# Patient Record
Sex: Female | Born: 1970 | Race: Black or African American | Hispanic: No | Marital: Single
Health system: Southern US, Community
[De-identification: ages and names within clinical notes are randomized; demographics above are authoritative.]

## PROBLEM LIST (undated history)

## (undated) DIAGNOSIS — F338 Other recurrent depressive disorders: Secondary | ICD-10-CM

## (undated) DIAGNOSIS — F419 Anxiety disorder, unspecified: Secondary | ICD-10-CM

## (undated) DIAGNOSIS — M25531 Pain in right wrist: Secondary | ICD-10-CM

## (undated) DIAGNOSIS — G4733 Obstructive sleep apnea (adult) (pediatric): Secondary | ICD-10-CM

## (undated) DIAGNOSIS — R1310 Dysphagia, unspecified: Secondary | ICD-10-CM

## (undated) DIAGNOSIS — Z90710 Acquired absence of both cervix and uterus: Secondary | ICD-10-CM

## (undated) DIAGNOSIS — M2140 Flat foot [pes planus] (acquired), unspecified foot: Secondary | ICD-10-CM

## (undated) DIAGNOSIS — M25512 Pain in left shoulder: Secondary | ICD-10-CM

## (undated) DIAGNOSIS — D509 Iron deficiency anemia, unspecified: Secondary | ICD-10-CM

## (undated) DIAGNOSIS — F339 Major depressive disorder, recurrent, unspecified: Secondary | ICD-10-CM

## (undated) DIAGNOSIS — F411 Generalized anxiety disorder: Secondary | ICD-10-CM

## (undated) DIAGNOSIS — R002 Palpitations: Secondary | ICD-10-CM

## (undated) DIAGNOSIS — M5137 Other intervertebral disc degeneration, lumbosacral region: Secondary | ICD-10-CM

## (undated) DIAGNOSIS — I10 Essential (primary) hypertension: Secondary | ICD-10-CM

## (undated) DIAGNOSIS — M51379 Other intervertebral disc degeneration, lumbosacral region without mention of lumbar back pain or lower extremity pain: Secondary | ICD-10-CM

## (undated) DIAGNOSIS — Z6835 Body mass index (BMI) 35.0-35.9, adult: Secondary | ICD-10-CM

## (undated) DIAGNOSIS — E119 Type 2 diabetes mellitus without complications: Secondary | ICD-10-CM

## (undated) DIAGNOSIS — T7523XA Vertigo from infrasound, initial encounter: Secondary | ICD-10-CM

## (undated) DIAGNOSIS — J45998 Other asthma: Secondary | ICD-10-CM

## (undated) DIAGNOSIS — J309 Allergic rhinitis, unspecified: Secondary | ICD-10-CM

## (undated) DIAGNOSIS — N83209 Unspecified ovarian cyst, unspecified side: Secondary | ICD-10-CM

## (undated) DIAGNOSIS — K219 Gastro-esophageal reflux disease without esophagitis: Secondary | ICD-10-CM

## (undated) HISTORY — DX: Flat foot (pes planus) (acquired), unspecified foot: M21.40

## (undated) HISTORY — DX: Pain in right wrist: M25.531

## (undated) HISTORY — DX: Generalized anxiety disorder: F41.1

## (undated) HISTORY — DX: Anxiety disorder, unspecified: F41.9

## (undated) HISTORY — DX: Iron deficiency anemia, unspecified: D50.9

## (undated) HISTORY — DX: Palpitations: R00.2

## (undated) HISTORY — DX: Dysphagia, unspecified: R13.10

## (undated) HISTORY — DX: Allergic rhinitis, unspecified: J30.9

## (undated) HISTORY — DX: Gastro-esophageal reflux disease without esophagitis: K21.9

## (undated) HISTORY — DX: Obstructive sleep apnea (adult) (pediatric): G47.33

## (undated) HISTORY — DX: Acquired absence of both cervix and uterus: Z90.710

## (undated) HISTORY — DX: Unspecified ovarian cyst, unspecified side: N83.209

## (undated) HISTORY — DX: Pain in left shoulder: M25.512

## (undated) HISTORY — DX: Other recurrent depressive disorders: F33.8

## (undated) HISTORY — DX: Major depressive disorder, recurrent, unspecified: F33.9

## (undated) HISTORY — DX: Type 2 diabetes mellitus without complications: E11.9

## (undated) HISTORY — DX: Essential (primary) hypertension: I10

## (undated) HISTORY — DX: Vertigo from infrasound, initial encounter: T75.23XA

## (undated) HISTORY — DX: Other intervertebral disc degeneration, lumbosacral region: M51.37

## (undated) HISTORY — DX: Body mass index (BMI) 35.0-35.9, adult: Z68.35

## (undated) HISTORY — DX: Other intervertebral disc degeneration, lumbosacral region without mention of lumbar back pain or lower extremity pain: M51.379

## (undated) HISTORY — PX: ABDOMINAL HYSTERECTOMY: SHX81

## (undated) HISTORY — DX: Other asthma: J45.998

---

## 2018-09-07 ENCOUNTER — Telehealth: Payer: Self-pay | Admitting: *Deleted

## 2018-09-07 DIAGNOSIS — R079 Chest pain, unspecified: Secondary | ICD-10-CM

## 2018-09-07 NOTE — Telephone Encounter (Signed)
No need to hold modafinil

## 2018-09-07 NOTE — Telephone Encounter (Signed)
Patient has been made aware of all instructions and has been instructed to call back with any questions.

## 2018-09-07 NOTE — Telephone Encounter (Signed)
Request from the [facility] for the patient to have a nuclear treadmill test. Order has been placed and instructions given. Message will be sent to scheduling.  The patient has been made aware of medications to hold but would like to know if she needs to hold modafinil the day of the test. She takes the Modafinil as a stimulant to help keep her awake throughout the day.  Medications to hold: All diabetic medications the morning of the test  1. Do not eat or drink after midnight 2. No caffeine for 24 hours prior to test 3. No smoking 24 hours prior to test. 4. Your medication may be taken with water.  If your doctor stopped a medication because of this test, do not take that medication. 5. Ladies, please do not wear dresses.  Skirts or pants are appropriate. Please wear a short sleeve shirt. 6. No perfume, cologne or lotion. 7. Wear comfortable walking shoes. No heels!

## 2018-09-13 ENCOUNTER — Ambulatory Visit: Payer: Self-pay | Admitting: Cardiovascular Disease

## 2018-09-15 ENCOUNTER — Other Ambulatory Visit: Payer: Self-pay

## 2018-09-15 ENCOUNTER — Ambulatory Visit
Admission: RE | Admit: 2018-09-15 | Discharge: 2018-09-15 | Disposition: A | Discharge status: Home / Self Care | Payer: No Typology Code available for payment source | Source: Ambulatory Visit | Attending: Cardiovascular Disease | Admitting: Cardiovascular Disease

## 2018-09-15 DIAGNOSIS — R079 Chest pain, unspecified: Secondary | ICD-10-CM | POA: Diagnosis not present

## 2018-09-15 LAB — NM MYOCAR MULTI W/SPECT W/WALL MOTION / EF
CSEPPHR: 171 {beats}/min
Estimated workload: 9 METS
Exercise duration (min): 7 min
Exercise duration (sec): 20 s
LV dias vol: 27 mL (ref 46–106)
LV sys vol: 9 mL
MPHR: 175 {beats}/min
Percent HR: 97 %
Rest HR: 84 {beats}/min
TID: 0.71

## 2018-09-15 MED ORDER — TECHNETIUM TC 99M TETROFOSMIN IV KIT
10.7200 | PACK | Freq: Once | INTRAVENOUS | Status: AC | PRN
  Administered 2018-09-15: 10.72 via INTRAVENOUS

## 2018-09-15 MED ORDER — TECHNETIUM TC 99M TETROFOSMIN IV KIT
30.0000 | PACK | Freq: Once | INTRAVENOUS | Status: AC | PRN
  Administered 2018-09-15: 30.41 via INTRAVENOUS

## 2019-04-27 ENCOUNTER — Other Ambulatory Visit: Payer: Self-pay

## 2019-04-27 DIAGNOSIS — M25552 Pain in left hip: Secondary | ICD-10-CM

## 2019-05-10 ENCOUNTER — Ambulatory Visit
Admission: RE | Admit: 2019-05-10 | Discharge: 2019-05-10 | Disposition: A | Discharge status: Home / Self Care | Payer: No Typology Code available for payment source | Source: Ambulatory Visit | Attending: Diagnostic Radiology | Admitting: Diagnostic Radiology

## 2019-05-10 ENCOUNTER — Other Ambulatory Visit: Payer: Self-pay

## 2019-05-10 DIAGNOSIS — M1611 Unilateral primary osteoarthritis, right hip: Secondary | ICD-10-CM | POA: Diagnosis not present

## 2019-05-10 DIAGNOSIS — M25552 Pain in left hip: Secondary | ICD-10-CM | POA: Diagnosis present

## 2021-07-19 ENCOUNTER — Encounter: Payer: Self-pay | Admitting: Emergency Medicine

## 2021-07-19 ENCOUNTER — Other Ambulatory Visit: Payer: Self-pay

## 2021-07-19 ENCOUNTER — Emergency Department: Payer: No Typology Code available for payment source

## 2021-07-19 ENCOUNTER — Emergency Department
Admission: EM | Admit: 2021-07-19 | Discharge: 2021-07-19 | Disposition: A | Discharge status: Home / Self Care | Payer: No Typology Code available for payment source | Attending: Emergency Medicine | Admitting: Emergency Medicine

## 2021-07-19 DIAGNOSIS — E876 Hypokalemia: Secondary | ICD-10-CM | POA: Diagnosis not present

## 2021-07-19 DIAGNOSIS — R072 Precordial pain: Secondary | ICD-10-CM | POA: Diagnosis present

## 2021-07-19 DIAGNOSIS — I10 Essential (primary) hypertension: Secondary | ICD-10-CM | POA: Diagnosis not present

## 2021-07-19 DIAGNOSIS — R079 Chest pain, unspecified: Secondary | ICD-10-CM

## 2021-07-19 LAB — BASIC METABOLIC PANEL
Anion gap: 9 (ref 5–15)
BUN: 18 mg/dL (ref 6–20)
CO2: 32 mmol/L (ref 22–32)
Calcium: 9.6 mg/dL (ref 8.9–10.3)
Chloride: 92 mmol/L — ABNORMAL LOW (ref 98–111)
Creatinine, Ser: 1 mg/dL (ref 0.44–1.00)
GFR, Estimated: >60 mL/min (ref >60)
Glucose, Bld: 147 mg/dL — ABNORMAL HIGH (ref 70–99)
Potassium: 2.5 mmol/L — CL (ref 3.5–5.1)
Sodium: 133 mmol/L — ABNORMAL LOW (ref 135–145)

## 2021-07-19 LAB — CBC
HCT: 34.9 % — ABNORMAL LOW (ref 36.0–46.0)
Hemoglobin: 12.6 g/dL (ref 12.0–15.0)
MCH: 33 pg (ref 26.0–34.0)
MCHC: 36.1 g/dL — ABNORMAL HIGH (ref 30.0–36.0)
MCV: 91.4 fL (ref 80.0–100.0)
Platelets: 255 10*3/uL (ref 150–400)
RBC: 3.82 MIL/uL — ABNORMAL LOW (ref 3.87–5.11)
RDW: 15 % (ref 11.5–15.5)
WBC: 6.5 10*3/uL (ref 4.0–10.5)
nRBC: 0 % (ref 0.0–0.2)

## 2021-07-19 LAB — MAGNESIUM: Magnesium: 2.8 mg/dL — ABNORMAL HIGH (ref 1.7–2.4)

## 2021-07-19 LAB — TROPONIN I (HIGH SENSITIVITY)
Troponin I (High Sensitivity): 4 ng/L (ref <18)
Troponin I (High Sensitivity): 4 ng/L (ref <18)

## 2021-07-19 MED ORDER — ACETAMINOPHEN 500 MG PO TABS
1000.0000 mg | ORAL_TABLET | Freq: Once | ORAL | Status: AC
  Administered 2021-07-19: 1000 mg via ORAL
  Filled 2021-07-19: qty 2

## 2021-07-19 MED ORDER — IBUPROFEN 400 MG PO TABS
400.0000 mg | ORAL_TABLET | Freq: Once | ORAL | Status: AC
  Administered 2021-07-19: 400 mg via ORAL
  Filled 2021-07-19: qty 1

## 2021-07-19 MED ORDER — IOHEXOL 350 MG/ML SOLN
100.0000 mL | Freq: Once | INTRAVENOUS | Status: AC | PRN
  Administered 2021-07-19: 75 mL via INTRAVENOUS
  Filled 2021-07-19: qty 100

## 2021-07-19 MED ORDER — LACTATED RINGERS IV BOLUS
500.0000 mL | Freq: Once | INTRAVENOUS | Status: AC
  Administered 2021-07-19: 500 mL via INTRAVENOUS

## 2021-07-19 MED ORDER — POTASSIUM CHLORIDE CRYS ER 20 MEQ PO TBCR
80.0000 meq | EXTENDED_RELEASE_TABLET | Freq: Once | ORAL | Status: AC
  Administered 2021-07-19: 80 meq via ORAL
  Filled 2021-07-19: qty 4

## 2021-07-19 NOTE — ED Provider Notes (Signed)
[facility] Provider Note    Event Date/Time   First MD Initiated Contact with Patient 07/19/21 1134     (approximate)   History   Abnormal Lab and Chest Pain   HPI  Krista Owens is a 51 y.o. female with a past medical history of rheumatoid arthritis currently receiving outpatient infusions of unknown medication at the [place], DM and distal lower extremity neuropathy who presents for assessment of some chest pain and low potassium.  Patient states she was called yesterday evening when she had some labs drawn yesterday that showed a low potassium.  She states this was a check to see if her potassium had come up but she was noted to have low potassium about 2 weeks ago and had been on oral supplementation.  She states that after talking to the nurse at the [facility] start developing some substernal chest pressure.  Symptoms come on over a few seconds while she was at rest sitting on the couch.  She states it radiated to her back.  She denies any cough, shortness of breath, fevers, headache, earache, sore throat vomiting or diarrhea but does endorse some nausea.  She denies any abdominal pain rash or extremity pain.  No recent falls or injuries.  She denies EtOH use or illicit drug use.  No other acute concerns at this time      Physical Exam  Triage Vital Signs: ED Triage Vitals  Enc Vitals Group     BP 07/19/21 1001 (!) 149/85     Pulse Rate 07/19/21 1001 (!) 103     Resp 07/19/21 1001 14     Temp 07/19/21 1001 98.5 F (36.9 C)     Temp Source 07/19/21 1001 Oral     SpO2 07/19/21 1001 96 %     Weight 07/19/21 0950 174 lb (78.9 kg)     Height 07/19/21 0950 5\' 2"  (1.575 m)     Head Circumference --      Peak Flow --      Pain Score 07/19/21 0950 8     Pain Loc --      Pain Edu? --      Excl. in GC? --     Most recent vital signs: Vitals:   07/19/21 1001 07/19/21 1218  BP: (!) 149/85 (!) 143/77  Pulse: (!) 103 88  Resp: 14 16  Temp: 98.5 F (36.9 C)    SpO2: 96% 100%    General: Awake, no distress.  CV:  Good peripheral perfusion.  No murmurs rubs or gallops.  2+ radial pulses.  Slight tachycardic. Resp:  Normal effort.  Clear bilaterally. Abd:  No distention.  Soft throughout. Other:  No significant lower extremity edema.   ED Results / Procedures / Treatments  Labs (all labs ordered are listed, but only abnormal results are displayed) Labs Reviewed  BASIC METABOLIC PANEL - Abnormal; Notable for the following components:      Result Value   Sodium 133 (*)    Potassium 2.5 (*)    Chloride 92 (*)    Glucose, Bld 147 (*)    All other components within normal limits  CBC - Abnormal; Notable for the following components:   RBC 3.82 (*)    HCT 34.9 (*)    MCHC 36.1 (*)    All other components within normal limits  MAGNESIUM - Abnormal; Notable for the following components:   Magnesium 2.8 (*)    All other components within normal limits  POC URINE PREG, ED  TROPONIN I (HIGH SENSITIVITY)  TROPONIN I (HIGH SENSITIVITY)     EKG  EKG remarkable for sinus tachycardia with a ventricular rate of 105 without clear evidence of acute ischemia or significant arrhythmia.  Left axis deviation and unremarkable intervals.   RADIOLOGY  Chest x-ray reviewed by myself shows no focal consolidation, effusion, edema, pneumothorax, cardiomegaly or other acute thoracic process.  I also reviewed radiology's interpretation and agree with their findings.  CTA chest reviewed myself shows no evidence of dissection, aneurysm, PE, pneumonia, pneumothorax or other clear acute thoracic process.  No significant atherosclerosis.  No other clear acute thoracic process.  I also reviewed radiology's interpretation and agree with their findings.  PROCEDURES:  Critical Care performed: No  .1-3 Lead EKG Interpretation Performed by: Gilles Chiquito, MD Authorized by: Gilles Chiquito, MD     Interpretation: non-specific     ECG rate assessment: normal      Rhythm: sinus rhythm     Ectopy: none     Conduction: normal   The patient is on the cardiac monitor to evaluate for evidence of arrhythmia and/or significant heart rate changes.    MEDICATIONS ORDERED IN ED: Medications  potassium chloride SA (KLOR-CON M) CR tablet 80 mEq (80 mEq Oral Given 07/19/21 1211)  lactated ringers bolus 500 mL (500 mLs Intravenous New Bag/Given 07/19/21 1248)  acetaminophen (TYLENOL) tablet 1,000 mg (1,000 mg Oral Given 07/19/21 1233)  ibuprofen (ADVIL) tablet 400 mg (400 mg Oral Given 07/19/21 1233)  iohexol (OMNIPAQUE) 350 MG/ML injection 100 mL (75 mLs Intravenous Contrast Given 07/19/21 1301)     IMPRESSION / MDM / ASSESSMENT AND PLAN / ED COURSE  I reviewed the triage vital signs and the nursing notes.                              Differential diagnosis includes, but is not limited to, ACS, dissection, PE, pneumonia, thorax, esophageal spasm possible chest wall inflammation.   EKG remarkable for sinus tachycardia with a ventricular rate of 105 without clear evidence of acute ischemia or significant arrhythmia.  Left axis deviation and unremarkable intervals.  Given nonelevated troponin x2 I have a low suspicion for ACS at this time.  Chest x-ray reviewed by myself shows no focal consolidation, effusion, edema, pneumothorax, cardiomegaly or other acute thoracic process.  I also reviewed radiology's interpretation and agree with their findings.  CTA chest reviewed myself shows no evidence of dissection, aneurysm, PE, pneumonia, pneumothorax or other clear acute thoracic process.  No significant atherosclerosis.  No other clear acute thoracic process.  I also reviewed radiology's interpretation and agree with their findings.  BMP remarkable for potassium of 2.5.  This could certainly be predisposing patient for some spasms of her chest wall.  No other significant electrolyte or metabolic derangements today.  CBC without evidence of leukocytosis or acute anemia.   Magnesium is 2.8.  On reassessment patient states she feels much better after oral potassium repletion Tylenol and some IV fluids.  Her tachycardia is resolved.  Given stable vitals otherwise reassuring exam work-up I think patient stable for discharge with continued outpatient follow-up and evaluation.  She states she has a prescription for oral supplementation which she will get filled never potassium rechecked later this week.  Discussed returning for any new or worsening symptoms.  Discharged stable condition.  Medications  potassium chloride SA (KLOR-CON M) CR tablet 80 mEq (80 mEq  Oral Given 07/19/21 1211)  lactated ringers bolus 500 mL (500 mLs Intravenous New Bag/Given 07/19/21 1248)  acetaminophen (TYLENOL) tablet 1,000 mg (1,000 mg Oral Given 07/19/21 1233)  ibuprofen (ADVIL) tablet 400 mg (400 mg Oral Given 07/19/21 1233)  iohexol (OMNIPAQUE) 350 MG/ML injection 100 mL (75 mLs Intravenous Contrast Given 07/19/21 1301)         FINAL CLINICAL IMPRESSION(S) / ED DIAGNOSES   Final diagnoses:  Chest pain, unspecified type  Hypokalemia  Hypertension, unspecified type     Rx / DC Orders   ED Discharge Orders     None        Note:  This document was prepared using Dragon voice recognition software and may include unintentional dictation errors.   Gilles ChiquitoSmith, Mirtha Jain P, MD 07/19/21 [phone]

## 2021-07-19 NOTE — ED Triage Notes (Signed)
Pt reports had labs done yesterday and her K+ was 2.8. pt reports her MD called but she did not want to go to the [place] so they told her if she started having CP to come to the ED here. Pt reports pain is center to right of chest and dull and achy with intermittent increases in intensity. Pt also reports some nausea

## 2021-07-19 NOTE — Discharge Instructions (Addendum)
Your CT of your chest today showed: FINDINGS: Cardiovascular: Preferential opacification of the thoracic aorta. Normal contour and caliber of the thoracic aorta. No evidence of aneurysm, dissection other acute aortic pathology. No significant atherosclerosis. No evidence of pulmonary embolism on this which is tailored for evaluation of the aorta. Normal heart size. No pericardial effusion.   Mediastinum/Nodes: No enlarged mediastinal, hilar, or axillary lymph nodes. Thyroid gland, trachea, and esophagus demonstrate no significant findings.   Lungs/Pleura: Lungs are clear. No pleural effusion or pneumothorax.   Upper Abdomen: No acute abnormality.   Musculoskeletal: No chest wall abnormality. No acute or significant osseous findings.   Review of the MIP images confirms the above findings.   IMPRESSION: Normal contour and caliber of the thoracic aorta. No evidence of aneurysm, dissection other acute aortic pathology. No significant atherosclerosis.

## 2022-05-18 IMAGING — CR DG CHEST 2V
1 series · 2 of 2 positions shown · non-contrast
Comparison: None.

CLINICAL DATA: Chest pain

EXAM:
CHEST - 2 VIEW

[Series 1: dg chest 2 view · 0.14mm/px · 2 of 2 slices shown]
[im 1/2]
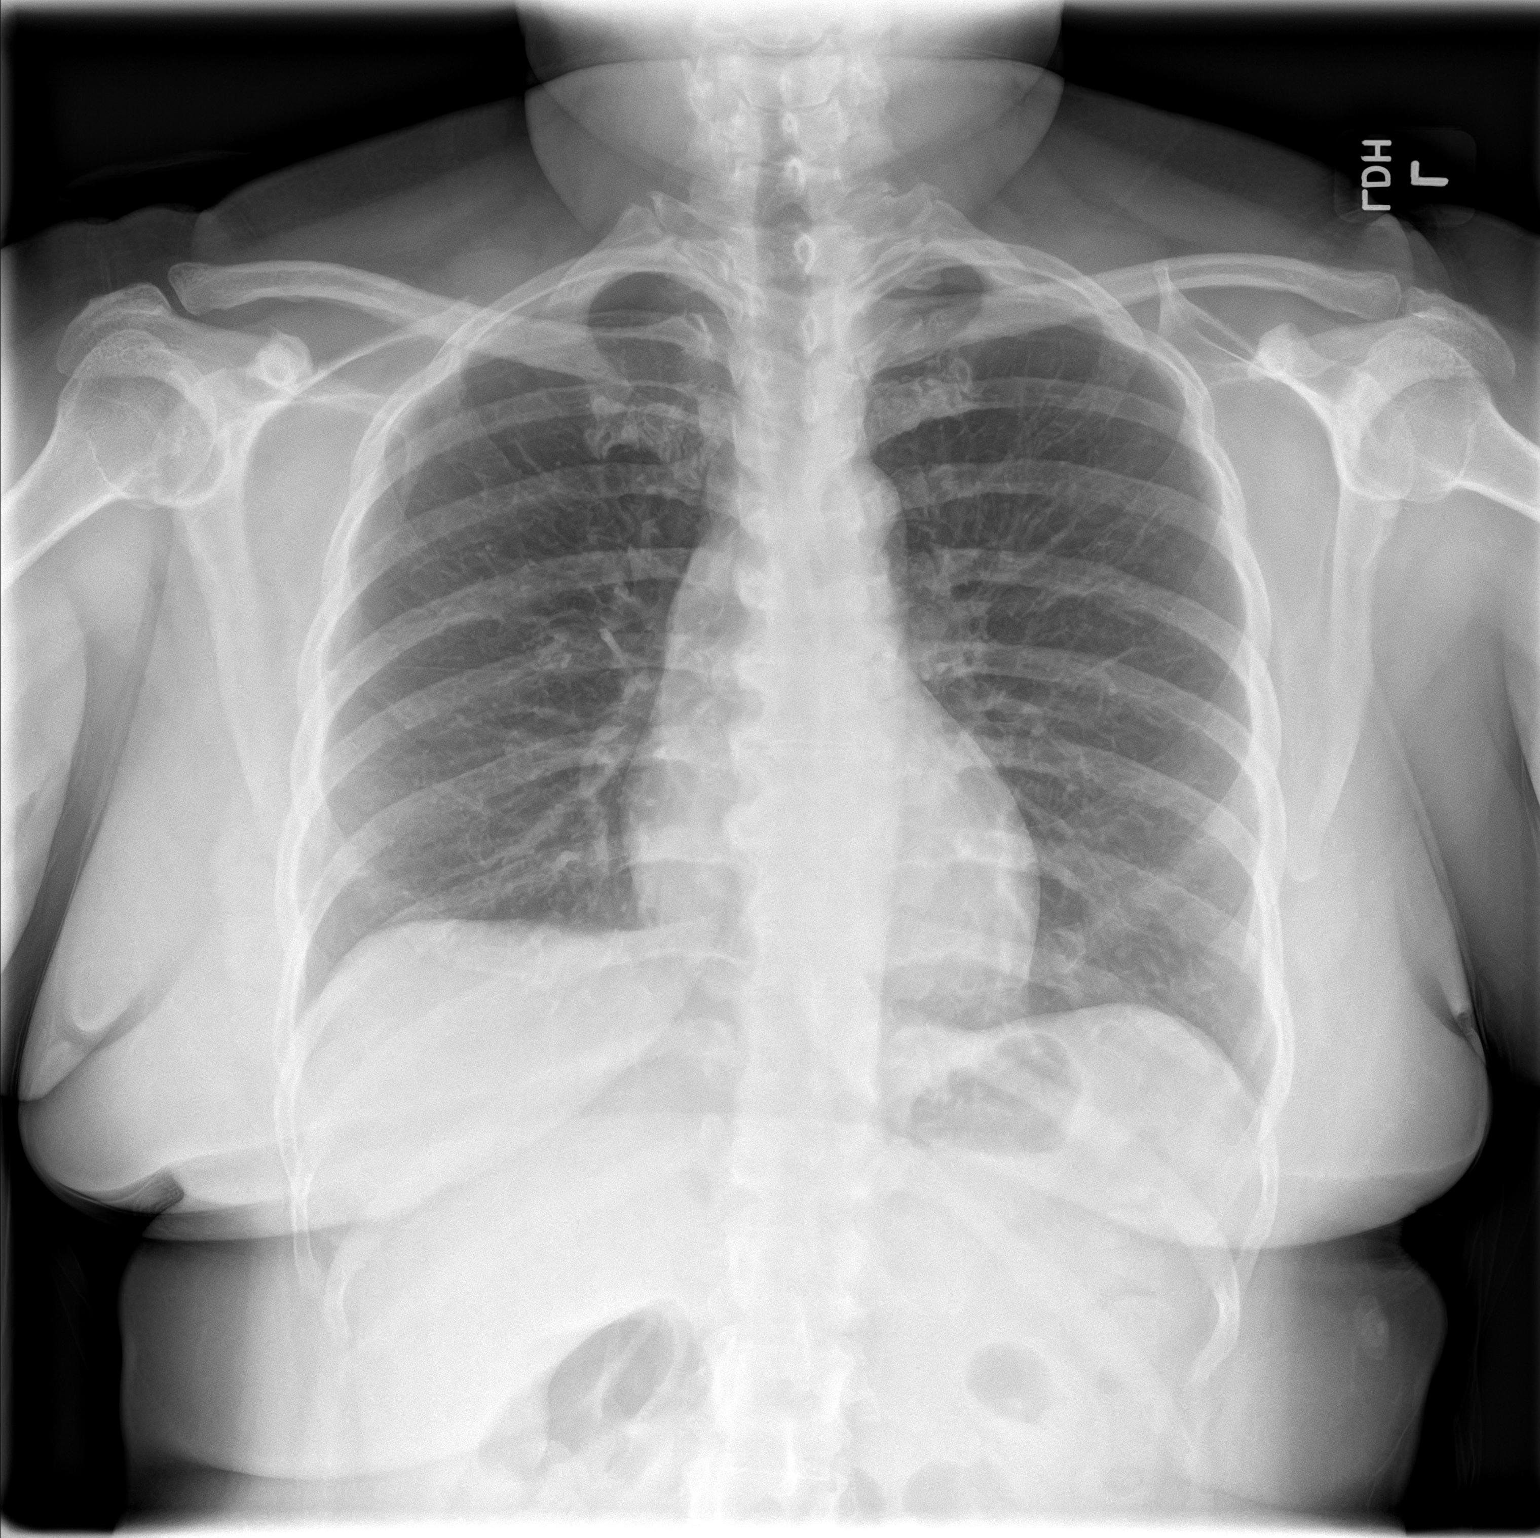
[im 2/2]
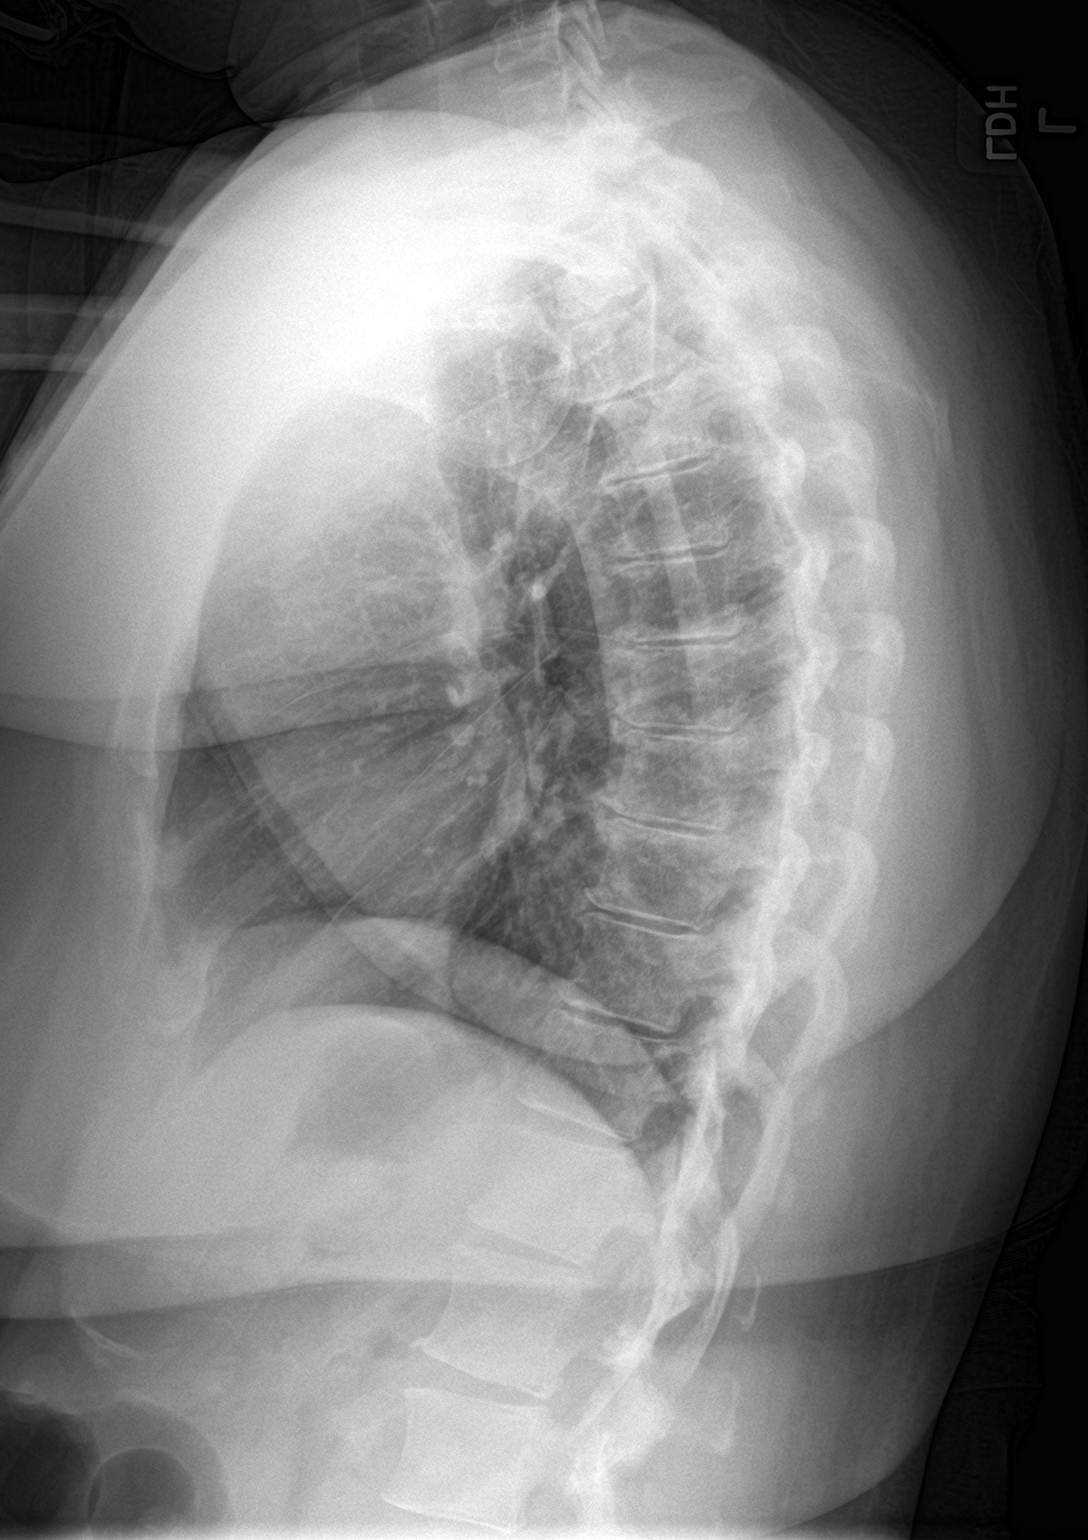

[2 of 2 positions shown; findings below may reference images not displayed]

FINDINGS: The lungs are clear without focal pneumonia, edema, pneumothorax or
pleural effusion. The cardiopericardial silhouette is within normal
limits for size. The visualized bony structures of the thorax show
no acute abnormality.
IMPRESSION: No active cardiopulmonary disease.
